# Patient Record
Sex: Male | Born: 1985 | Hispanic: Yes | Marital: Married | Smoking: Former smoker
Health system: Southern US, Community
[De-identification: ages and names within clinical notes are randomized; demographics above are authoritative.]

## PROBLEM LIST (undated history)

## (undated) DIAGNOSIS — J45909 Unspecified asthma, uncomplicated: Secondary | ICD-10-CM

## (undated) HISTORY — PX: HERNIA REPAIR: SHX51

## (undated) HISTORY — DX: Unspecified asthma, uncomplicated: J45.909

---

## 2013-02-21 ENCOUNTER — Encounter (HOSPITAL_COMMUNITY): Payer: Self-pay

## 2013-02-21 ENCOUNTER — Emergency Department (HOSPITAL_COMMUNITY)
Admission: EM | Admit: 2013-02-21 | Discharge: 2013-02-21 | Disposition: A | Discharge status: Home / Self Care | Payer: BC Managed Care – PPO | Attending: Emergency Medicine | Admitting: Emergency Medicine

## 2013-02-21 DIAGNOSIS — S61412A Laceration without foreign body of left hand, initial encounter: Secondary | ICD-10-CM

## 2013-02-21 DIAGNOSIS — Y9389 Activity, other specified: Secondary | ICD-10-CM | POA: Insufficient documentation

## 2013-02-21 DIAGNOSIS — Y9289 Other specified places as the place of occurrence of the external cause: Secondary | ICD-10-CM | POA: Insufficient documentation

## 2013-02-21 DIAGNOSIS — S61409A Unspecified open wound of unspecified hand, initial encounter: Secondary | ICD-10-CM | POA: Insufficient documentation

## 2013-02-21 DIAGNOSIS — W260XXA Contact with knife, initial encounter: Secondary | ICD-10-CM | POA: Insufficient documentation

## 2013-02-21 DIAGNOSIS — W261XXA Contact with sword or dagger, initial encounter: Secondary | ICD-10-CM | POA: Insufficient documentation

## 2013-02-21 NOTE — ED Notes (Signed)
Wound dressed with bacitracin and bandaid. 

## 2013-02-21 NOTE — ED Notes (Signed)
Pt cut his hand between his third and fourth finger while cutting a hamburger, bleeding controlled

## 2013-02-21 NOTE — ED Provider Notes (Signed)
History  This chart was scribed for non-physician practitioner Magnus Sinning, PA-C working with Richardean Canal, MD, by Candelaria Stagers, ED Scribe. This patient was seen in room WTR9/WTR9 and the patient's care was started at 10:41 PM   CSN: [id number]  Arrival date & time 02/21/13  2105   First MD Initiated Contact with Patient 02/21/13 2230      Chief Complaint  Patient presents with  . Extremity Laceration    Patient is a 27 y.o. male presenting with skin laceration. The history is provided by the patient. No language interpreter was used.  Laceration Location:  Hand Hand laceration location:  L hand Quality: straight   Bleeding: controlled   Pain details:    Severity:  Mild   Progression:  Improving Foreign body present:  No foreign bodies Worsened by:  Nothing tried  HPI Comments: Tyler Lopez is a 27 y.o. male who presents to the Emergency Department complaining of a laceration to the palm of his left hand between the fourth and fifth digit after he cut it with a knife about two hours ago.  His tetanus is up to date, 2 months ago.      History reviewed. No pertinent past medical history.  History reviewed. No pertinent past surgical history.  History reviewed. No pertinent family history.  History  Substance Use Topics  . Smoking status: Not on file  . Smokeless tobacco: Not on file  . Alcohol Use: No      Review of Systems  Skin: Positive for wound (laceration between the fourth and fifth digit of the left hand).  All other systems reviewed and are negative.    Allergies  Review of patient's allergies indicates no known allergies.  Home Medications  No current outpatient prescriptions on file.  BP 104/65  Pulse 65  Temp(Src) 98.8 F (37.1 C) (Oral)  Resp 18  Ht 5\' 7"  (1.702 m)  Wt 140 lb (63.504 kg)  BMI 21.92 kg/m2  SpO2 100%  Physical Exam  Nursing note and vitals reviewed. Constitutional: He is oriented to person, place, and time.  He appears well-developed and well-nourished. No distress.  HENT:  Head: Normocephalic and atraumatic.  Eyes: EOM are normal.  Neck: Neck supple. No tracheal deviation present.  Cardiovascular: Normal rate and regular rhythm.   Good cap refill  Pulmonary/Chest: Effort normal. No respiratory distress. He has no wheezes. He has no rales.  Musculoskeletal: Normal range of motion.  Full ROM of MCP to left fourth and fifth digit.     Neurological: He is alert and oriented to person, place, and time.  Sensation intact.   Skin: Skin is warm and dry.  1 cm superficial linear just proximal to the web space between the fourth and fifth digit along the palm of the left hand.  Bleeding controlled.  Psychiatric: He has a normal mood and affect. His behavior is normal.    ED Course  Procedures  DIAGNOSTIC STUDIES: Oxygen Saturation is 100% on room air, normal by my interpretation.    COORDINATION OF CARE:  10:44 PM Discussed course of care with pt which includes laceration repair.  Pt understands and agrees.   LACERATION REPAIR PROCEDURE NOTE The patient's identification was confirmed and consent was obtained. This procedure was performed by Magnus Sinning, PA-C at 11:26 PM. Site: between the fourth and fifth digits of the left hand just proximal to the web space on the palmar side Sterile procedures observed Anesthetic used (type and amt): 2%  lidocaine with epi, 2 ml Suture type/size: 3-0 prolene Length:1 cm  # of Sutures: 2 sutures  Technique: simple interrupted Antibx ointment applied Tetanus UTD Site anesthetized, irrigated with NS, explored without evidence of foreign body, wound well approximated, site covered with dry, sterile dressing.  Patient tolerated procedure well without complications. Instructions for care discussed verbally and patient provided with additional written instructions for homecare and f/u.  Labs Reviewed - No data to display No results found.   No  diagnosis found.    MDM  Patient presenting with laceration of the hand.  Laceration superficial.  No apparent tendon damage.  Neurovascularly intact.  Laceration repaired with sutures without difficulty.  Patient stable for discharge.  I personally performed the services described in this documentation, which was scribed in my presence. The recorded information has been reviewed and is accurate.        Pascal Lux Stokes, PA-C 02/22/13 0104

## 2013-02-21 NOTE — ED Notes (Signed)
Pt ambulatory to exam room with steady gait. Pt arrives with companion.  

## 2013-02-23 NOTE — ED Provider Notes (Signed)
Medical screening examination/treatment/procedure(s) were performed by non-physician practitioner and as supervising physician I was immediately available for consultation/collaboration.   Nadyne Gariepy H Carylon Tamburro, MD 02/23/13 1459 

## 2014-01-23 ENCOUNTER — Telehealth: Payer: Self-pay

## 2014-01-23 NOTE — Telephone Encounter (Signed)
Left message for call back Non identifiable  

## 2014-01-24 ENCOUNTER — Encounter: Payer: Self-pay | Admitting: Internal Medicine

## 2014-01-24 ENCOUNTER — Ambulatory Visit (INDEPENDENT_AMBULATORY_CARE_PROVIDER_SITE_OTHER): Payer: BC Managed Care – PPO | Admitting: Internal Medicine

## 2014-01-24 ENCOUNTER — Ambulatory Visit (HOSPITAL_BASED_OUTPATIENT_CLINIC_OR_DEPARTMENT_OTHER)
Admission: RE | Admit: 2014-01-24 | Discharge: 2014-01-24 | Disposition: A | Discharge status: Home / Self Care | Payer: BC Managed Care – PPO | Source: Ambulatory Visit | Attending: Internal Medicine | Admitting: Internal Medicine

## 2014-01-24 VITALS — BP 95/67 | HR 84 | Temp 97.9°F | Ht 68.0 in | Wt 133.0 lb

## 2014-01-24 DIAGNOSIS — M79609 Pain in unspecified limb: Secondary | ICD-10-CM | POA: Insufficient documentation

## 2014-01-24 DIAGNOSIS — Z0000 Encounter for general adult medical examination without abnormal findings: Secondary | ICD-10-CM

## 2014-01-24 DIAGNOSIS — M1990 Unspecified osteoarthritis, unspecified site: Secondary | ICD-10-CM

## 2014-01-24 NOTE — Progress Notes (Signed)
Pre visit review using our clinic review tool, if applicable. No additional management support is needed unless otherwise documented below in the visit note. 

## 2014-01-24 NOTE — Telephone Encounter (Signed)
Unable to reach prior to visit  

## 2014-01-24 NOTE — Progress Notes (Signed)
   Subjective:    Patient ID: Tyler Lopez, male    DOB: 1985-12-07, 28 y.o.   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 Asthma     as a achild     Past Surgical History  Procedure Laterality Date  . Hernia repair Right     28 y/o    History   Social History  . Marital Status: Married    Spouse Name: N/A    Number of Children: 1  . Years of Education: N/A   Occupational History  . factory Financial controllerworker , Doctor, hospitalealey     Social History Main Topics  . Smoking status: Former Games developermoker  . Smokeless tobacco: Never Used     Comment: quit 12-2013  . Alcohol Use: Yes  . Drug Use: Yes     Comment: occ marihuana   . Sexual Activity: Not on file   Other Topics Concern  . Not on file   Social History Narrative   [place] from [place] ~ 11-2013     Family History  Problem Relation Age of Onset  . Colon cancer Neg Hx   . Prostate cancer Neg Hx   . Diabetes Mother   . Throat cancer Other     GP      Medication List    Notice As of 01/24/2014 11:59 PM   You have not been prescribed any medications.         Objective:   Physical Exam BP 95/67  Pulse 84  Temp(Src) 97.9 F (36.6 C)  Ht 5\' 8"  (1.727 m)  Wt 133 lb (60.328 kg)  BMI 20.23 kg/m2  SpO2 97% General -- alert, well-developed, NAD.  Neck --no thyromegaly   HEENT-- Not pale.  Lungs -- normal respiratory effort, no intercostal retractions, no accessory muscle use, and normal breath sounds.  Heart-- normal rate, regular rhythm, no murmur.  Abdomen-- Not distended, good bowel  sounds,soft, non-tender. Extremities-- no pretibial edema bilaterally  Hands and wrists-- no synovitis but all PIPs and DIPs are quite enlarged, slt tender? No red-warm Neurologic--  alert & oriented X3. Speech normal, gait normal, strength normal in all extremities.  Psych-- Cognition and judgment appear intact. Cooperative with normal attention span and concentration. No anxious or depressed appearing.        Assessment & Plan:

## 2014-01-24 NOTE — Assessment & Plan Note (Signed)
occ hand soreness , no synovitis on exam but hand joints quite prominent, check XRs (?early DJD, r/o erosions). Check a sed rate

## 2014-01-24 NOTE — Assessment & Plan Note (Signed)
up todate on [facility] STE  Concerned about his wt, has been ~130, 140 pounds all his life. rec abundant but healthy nutrition.

## 2014-01-24 NOTE — Patient Instructions (Signed)
Get your blood work before you leave   Get the XR at [facility], corner of [address] (10 minutes form here); they are open 24/7 [address]  [place], [place] [id number] [phone]  Next visit is for a physical exam in 1 year, fasting Please make an appointment       Testicular Self-Exam A self-examination of your testicles involves looking at and feeling your testicles for abnormal lumps or swelling. Several things can cause swelling, lumps, or pain in your testicles. Some of these causes are:  Injuries.  Inflammation.  Infection.  Accumulation of fluids around your testicle (hydrocele).  Twisted testicles (testicular torsion).  Testicular cancer. Self-examination of the testicles and groin areas may be advised if you are at risk for testicular cancer. Risks for testicular cancer include:  An undescended testicle (cryptorchidism).  A history of previous testicular cancer.  A family history of testicular cancer. The testicles are easiest to examine after warm baths or showers and are more difficult to examine when you are cold. This is because the muscles attached to the testicles retract and pull them up higher or into the abdomen. Follow these steps while you are standing:  Hold your penis away from your body.  Roll one testicle between your thumb and forefinger, feeling the entire testicle.  Roll the other testicle between your thumb and forefinger, feeling the entire testicle. Feel for lumps, swelling, or discomfort. A normal testicle is egg shaped and feels firm. It is smooth and not tender. The spermatic cord can be felt as a firm spaghetti-like cord at the back of your testicle. It is also important to examine the crease between the front of your leg and your abdomen. Feel for any bumps that are tender. These could be enlarged lymph nodes.  Document Released: 12/05/2000 Document Revised: 05/01/2013 Document Reviewed:  02/18/2013 [facility] Patient Information 2014 [place].

## 2014-01-25 LAB — CBC WITH DIFFERENTIAL/PLATELET
BASOS PCT: 0 % (ref 0–1)
Basophils Absolute: 0 10*3/uL (ref 0.0–0.1)
EOS ABS: 0.1 10*3/uL (ref 0.0–0.7)
Eosinophils Relative: 1 % (ref 0–5)
HCT: 43.1 % (ref 39.0–52.0)
Hemoglobin: 15 g/dL (ref 13.0–17.0)
Lymphocytes Relative: 32 % (ref 12–46)
Lymphs Abs: 3.3 10*3/uL (ref 0.7–4.0)
MCH: 31.3 pg (ref 26.0–34.0)
MCHC: 34.8 g/dL (ref 30.0–36.0)
MCV: 90 fL (ref 78.0–100.0)
MONOS PCT: 7 % (ref 3–12)
Monocytes Absolute: 0.7 10*3/uL (ref 0.1–1.0)
Neutro Abs: 6.1 10*3/uL (ref 1.7–7.7)
Neutrophils Relative %: 60 % (ref 43–77)
PLATELETS: 310 10*3/uL (ref 150–400)
RBC: 4.79 MIL/uL (ref 4.22–5.81)
RDW: 13.5 % (ref 11.5–15.5)
WBC: 10.2 10*3/uL (ref 4.0–10.5)

## 2014-01-25 LAB — COMPREHENSIVE METABOLIC PANEL
ALK PHOS: 89 U/L (ref 39–117)
ALT: 28 U/L (ref 0–53)
AST: 31 U/L (ref 0–37)
Albumin: 4.5 g/dL (ref 3.5–5.2)
BILIRUBIN TOTAL: 0.4 mg/dL (ref 0.2–1.2)
BUN: 19 mg/dL (ref 6–23)
CO2: 25 meq/L (ref 19–32)
CREATININE: 1.05 mg/dL (ref 0.50–1.35)
Calcium: 9.9 mg/dL (ref 8.4–10.5)
Chloride: 104 mEq/L (ref 96–112)
GLUCOSE: 86 mg/dL (ref 70–99)
Potassium: 4.2 mEq/L (ref 3.5–5.3)
Sodium: 140 mEq/L (ref 135–145)
TOTAL PROTEIN: 7.1 g/dL (ref 6.0–8.3)

## 2014-01-25 LAB — TSH: TSH: 2.386 u[IU]/mL (ref 0.350–4.500)

## 2014-01-25 LAB — SEDIMENTATION RATE: SED RATE: 1 mm/h (ref 0–16)

## 2014-01-25 LAB — CHOLESTEROL, TOTAL: CHOLESTEROL: 206 mg/dL — AB (ref 0–200)

## 2014-01-28 ENCOUNTER — Encounter: Payer: Self-pay | Admitting: *Deleted

## 2014-01-28 NOTE — Progress Notes (Signed)
Letter sent to patient regarding test results

## 2015-05-01 ENCOUNTER — Other Ambulatory Visit (INDEPENDENT_AMBULATORY_CARE_PROVIDER_SITE_OTHER): Payer: BLUE CROSS/BLUE SHIELD

## 2015-05-01 ENCOUNTER — Encounter: Payer: Self-pay | Admitting: Family

## 2015-05-01 ENCOUNTER — Ambulatory Visit (INDEPENDENT_AMBULATORY_CARE_PROVIDER_SITE_OTHER): Payer: BLUE CROSS/BLUE SHIELD | Admitting: Family

## 2015-05-01 VITALS — BP 102/66 | HR 79 | Temp 97.9°F | Resp 18 | Ht 68.0 in | Wt 137.0 lb

## 2015-05-01 DIAGNOSIS — M1990 Unspecified osteoarthritis, unspecified site: Secondary | ICD-10-CM

## 2015-05-01 LAB — SEDIMENTATION RATE: Sed Rate: 4 mm/hr (ref 0–22)

## 2015-05-01 LAB — RHEUMATOID FACTOR: Rhuematoid fact SerPl-aCnc: <10 IU/mL (ref <=14)

## 2015-05-01 LAB — C-REACTIVE PROTEIN: CRP: <0.1 mg/dL — ABNORMAL LOW (ref 0.5–20.0)

## 2015-05-01 MED ORDER — MELOXICAM 15 MG PO TABS: 7.5000 mg | ORAL_TABLET | Freq: Every day | ORAL | Status: AC

## 2015-05-01 NOTE — Progress Notes (Signed)
   Subjective:    Patient ID: Tyler Lopez, male    DOB: [birth year], 29 y.o.   MRN: [id number]  Chief Complaint  Patient presents with  . Establish Care    has problems with his hands, states that it happens on and off, its getting to where his finger cramp up and he can't even where his wedding ring, wants to know about referral to rheumatology, mom has bad arthritis    HPI:  Tyler Lopez is a 29 y.o. male with a PMH of asthma and hernia repair who presents today for an office visit and to establish care with this provider.    1.) Hand pain - Associated symptom of pain located in both hands described as stabbing. Reports that his fingers swell on and off. Modifying factors include ibuprofen and aleve which help a little. 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 He is primarily right handed. Severity of the pain 7-8/10 on average with timing of symptoms being worst in the morning and mid-afternoon. His family history is significant for arthritis.   No Known Allergies  No current outpatient prescriptions on file prior to visit.   No current facility-administered medications on file prior to visit.    Review of Systems  Constitutional: Negative for fever and chills.  Musculoskeletal: Positive for joint swelling and arthralgias.      Objective:    BP 102/66 mmHg  Pulse 79  Temp(Src) 97.9 F (36.6 C) (Oral)  Resp 18  Ht $R'5\' 8"'Gz$  (1.727 m)  Wt 137 lb (62.143 kg)  BMI 20.84 kg/m2  SpO2 97% Nursing note and vital signs reviewed.  Physical Exam  Constitutional: He is oriented to person, place, and time. He appears well-developed and well-nourished. No distress.  Cardiovascular: Normal rate, regular rhythm, normal heart sounds and intact distal pulses.   Pulmonary/Chest: Effort normal and breath sounds normal.  Musculoskeletal:  Bilateral Hands - Brouchard's appearing nodes present.  Finger range of motion is limited secondary to inflammation. Pulses and sensation are intact and appropriate.   Neurological: He is alert and oriented to person, place, and time.  Skin: Skin is warm and dry.  Psychiatric: He has a normal mood and affect. His behavior is normal. Judgment and thought content normal.       Assessment & Plan:   Problem List Items Addressed This Visit      Musculoskeletal and Integument   Arthritis - Primary    Symptoms and exam consistent with osteoarthritis most likely related to repetitive motion at work. Previous x-rays showed no arthropathy about 1 year ago. Obtain rheumatoid factor, ESR, CRP, and ANA to rule out rheumatoid arthritis. Start meloxicam as needed for pain. Treat conservatively with heat/ice as needed. Follow up pending lab results.       Relevant Medications   meloxicam (MOBIC) 15 MG tablet   Other Relevant Orders   Rheumatoid Factor   C-reactive protein   Sed Rate (ESR)   Antinuclear Antib (ANA)

## 2015-05-01 NOTE — Patient Instructions (Addendum)
Thank you for choosing [facility].  Summary/Instructions:  Your prescription(s) have been submitted to your pharmacy or been printed and provided for you. Please take as directed and contact our office if you believe you are having problem(s) with the medication(s) or have any questions.  Please stop by the lab on the basement level of the building for your blood work. Your results will be released to MyChart (or called to you) after review, usually within 72 hours after test completion. If any changes need to be made, you will be notified at that same time.  If your symptoms worsen or fail to improve, please contact our office for further instruction, or in case of emergency go directly to the emergency room at the closest medical facility.   Osteoarthritis Osteoarthritis is a disease that causes soreness and inflammation of a joint. It occurs when the cartilage at the affected joint wears down. Cartilage acts as a cushion, covering the ends of bones where they meet to form a joint. Osteoarthritis is the most common form of arthritis. It often occurs in older people. The joints affected most often by this condition include those in the:  Ends of the fingers.  Thumbs.  Neck.  Lower back.  Knees.  Hips. CAUSES  Over time, the cartilage that covers the ends of bones begins to wear away. This causes bone to rub on bone, producing pain and stiffness in the affected joints.  RISK FACTORS Certain factors can increase your chances of having osteoarthritis, including:  Older age.  Excessive body weight.  Overuse of joints.  Previous joint injury. SIGNS AND SYMPTOMS   Pain, swelling, and stiffness in the joint.  Over time, the joint may lose its normal shape.  Small deposits of bone (osteophytes) may grow on the edges of the joint.  Bits of bone or cartilage can break off and float inside the joint space. This may cause more pain and damage. DIAGNOSIS  Your health care  provider will do a physical exam and ask about your symptoms. Various tests may be ordered, such as:  X-rays of the affected joint.  An MRI scan.  Blood tests to rule out other types of arthritis.  Joint fluid tests. This involves using a needle to draw fluid from the joint and examining the fluid under a microscope. TREATMENT  Goals of treatment are to control pain and improve joint function. Treatment plans may include:  A prescribed exercise program that allows for rest and joint relief.  A weight control plan.  Pain relief techniques, such as:  Properly applied heat and cold.  Electric pulses delivered to nerve endings under the skin (transcutaneous electrical nerve stimulation [TENS]).  Massage.  Certain nutritional supplements.  Medicines to control pain, such as:  Acetaminophen.  Nonsteroidal anti-inflammatory drugs (NSAIDs), such as naproxen.  Narcotic or central-acting agents, such as tramadol.  Corticosteroids. These can be given orally or as an injection.  Surgery to reposition the bones and relieve pain (osteotomy) or to remove loose pieces of bone and cartilage. Joint replacement may be needed in advanced states of osteoarthritis. HOME CARE INSTRUCTIONS   Take medicines only as directed by your health care provider.  Maintain a healthy weight. Follow your health care provider's instructions for weight control. This may include dietary instructions.  Exercise as directed. Your health care provider can recommend specific types of exercise. These may include:  Strengthening exercises. These are done to strengthen the muscles that support joints affected by arthritis. They can be  performed with weights or with exercise bands to add resistance.  Aerobic activities. These are exercises, such as brisk walking or low-impact aerobics, that get your heart pumping.  Range-of-motion activities. These keep your joints limber.  Balance and agility exercises. These  help you maintain daily living skills.  Rest your affected joints as directed by your health care provider.  Keep all follow-up visits as directed by your health care provider. SEEK MEDICAL CARE IF:   Your skin turns red.  You develop a rash in addition to your joint pain.  You have worsening joint pain.  You have a fever along with joint or muscle aches. SEEK IMMEDIATE MEDICAL CARE IF:  You have a significant loss of weight or appetite.  You have night sweats. FOR MORE INFORMATION   National Institute of Arthritis and Musculoskeletal and Skin Diseases: www.niams.http://www.myers.net/  [employer] on Aging: https://walker.com/  American College of Rheumatology: www.rheumatology.org Document Released: 08/29/2005 Document Revised: 01/13/2014 Document Reviewed: 05/06/2013 [facility] Patient Information 2015 [place], [place]. This information is not intended to replace advice given to you by your health care provider. Make sure you discuss any questions you have with your health care provider.   Rheumatoid Arthritis Rheumatoid arthritis is a long-term (chronic) inflammatory disease that causes pain, swelling, and stiffness of the joints. It can affect the entire body, including the eyes and lungs. The effects of rheumatoid arthritis vary widely among those with the condition. CAUSES  The cause of rheumatoid arthritis is not known. It tends to run in families and is more common in women. Certain cells of the body's natural defense system (immune system) do not work properly and begin to attack healthy joints. It primarily involves the connective tissue that lines the joints (synovial membrane). This can cause damage to the joint. SYMPTOMS   Pain, stiffness, swelling, and decreased motion of many joints, especially in the hands and feet.  Stiffness that is worse in the morning. It may last 1-2 hours or longer.  Numbness and tingling in the hands.  Fatigue.  Loss of appetite.  Weight  loss.  Low-grade fever.  Dry eyes and mouth.  Firm lumps (rheumatoid nodules) that grow beneath the skin in areas such as the elbows and hands. DIAGNOSIS  Diagnosis is based on the symptoms described, an exam, and blood tests. Sometimes, X-rays are helpful. TREATMENT  The goals of treatment are to relieve pain, reduce inflammation, and to slow down or stop joint damage and disability. Methods vary and may include:  Maintaining a balance of rest, exercise, and proper nutrition.  Medicines:  Pain relievers (analgesics).  Corticosteroids and nonsteroidal anti-inflammatory drugs (NSAIDs) to reduce inflammation.  Disease-modifying antirheumatic drugs (DMARDs) to try to slow the course of the disease.  Biologic response modifiers to reduce inflammation and damage.  Physical therapy and occupational therapy.  Surgery for patients with severe joint damage. Joint replacement or fusing of joints may be needed.  Routine monitoring and ongoing care, such as office visits, blood and urine tests, and X-rays. HOME CARE INSTRUCTIONS   Remain physically active and reduce activity when the disease gets worse.  Eat a well-balanced diet.  Put heat on affected joints when you wake up and before activities. Keep the heat on the affected joint for as long as directed by your health care provider.  Put ice on affected joints following activities or exercising.  Put ice in a plastic bag.  Place a towel between your skin and the bag.  Leave the ice on for  15-20 minutes, 3-4 times per day, or as directed by your health care provider.  Take medicines and supplements only as directed by your health care provider.  Use splints as directed by your health care provider. Splints help maintain joint position and function.  Do not sleep with pillows under your knees. This may lead to spasms.  Participate in a self-management program to keep current with the latest treatment and coping skills. SEEK  IMMEDIATE MEDICAL CARE IF:  You have fainting episodes.  You have periods of extreme weakness.  You rapidly develop a hot, painful joint that is more severe than usual joint aches.  You have chills.  You have a fever. FOR MORE INFORMATION   American College of Rheumatology: www.rheumatology.org  Arthritis Foundation: www.arthritis.org Document Released: 08/26/2000 Document Revised: 01/13/2014 Document Reviewed: 10/05/2011 [facility] Patient Information 2015 [place], [place]. This information is not intended to replace advice given to you by your health care provider. Make sure you discuss any questions you have with your health care provider.

## 2015-05-01 NOTE — Assessment & Plan Note (Signed)
Symptoms and exam consistent with osteoarthritis most likely related to repetitive motion at work. Previous x-rays showed no arthropathy about 1 year ago. Obtain rheumatoid factor, ESR, CRP, and ANA to rule out rheumatoid arthritis. Start meloxicam as needed for pain. Treat conservatively with heat/ice as needed. Follow up pending lab results.

## 2015-05-01 NOTE — Progress Notes (Signed)
Pre visit review using our clinic review tool, if applicable. No additional management support is needed unless otherwise documented below in the visit note. 

## 2015-05-04 ENCOUNTER — Telehealth: Payer: Self-pay | Admitting: Family

## 2015-05-04 DIAGNOSIS — M1990 Unspecified osteoarthritis, unspecified site: Secondary | ICD-10-CM

## 2015-05-04 LAB — ANA: ANA: NEGATIVE

## 2015-05-04 NOTE — Telephone Encounter (Signed)
Please inform patient that his blood work shows that his tests for rheumatoid arthritis are negative and therefore most likely dealing with osteoarthritis. Therefore we can continue with the rheumatology consult if the patient wishes.

## 2015-05-06 NOTE — Telephone Encounter (Signed)
LVM for pt to call back.

## 2015-05-07 NOTE — Telephone Encounter (Signed)
Patients wife called back.  I gave her the results.  She would like referral to rheumatologist to be entered.

## 2015-05-07 NOTE — Telephone Encounter (Signed)
Referral placed.

## 2015-05-07 NOTE — Telephone Encounter (Signed)
Patients wife calling you back  Can you please call her at [phone] regarding pts lab work

## 2015-11-06 IMAGING — CR DG HAND COMPLETE 3+V*L*
3 series · 3 of 3 positions shown · non-contrast
Comparison: None.

CLINICAL DATA: Bilateral hand pain.

EXAM:
LEFT HAND - COMPLETE 3+ VIEW

[x hand pa left]
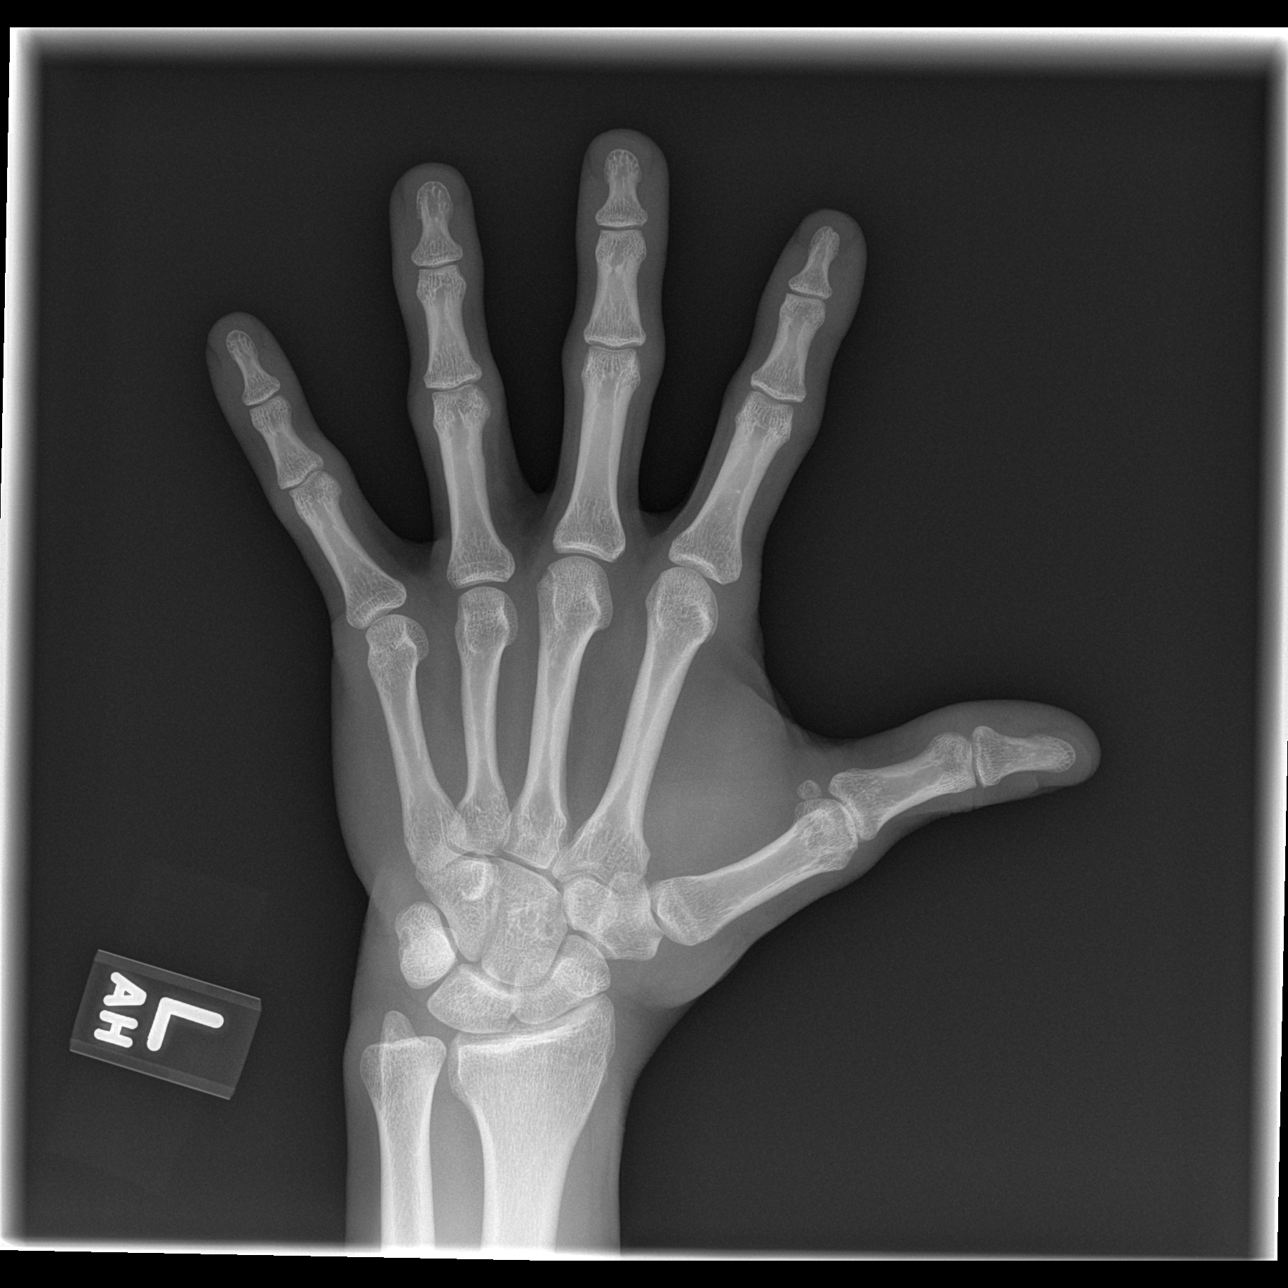

[x hand oblique left]
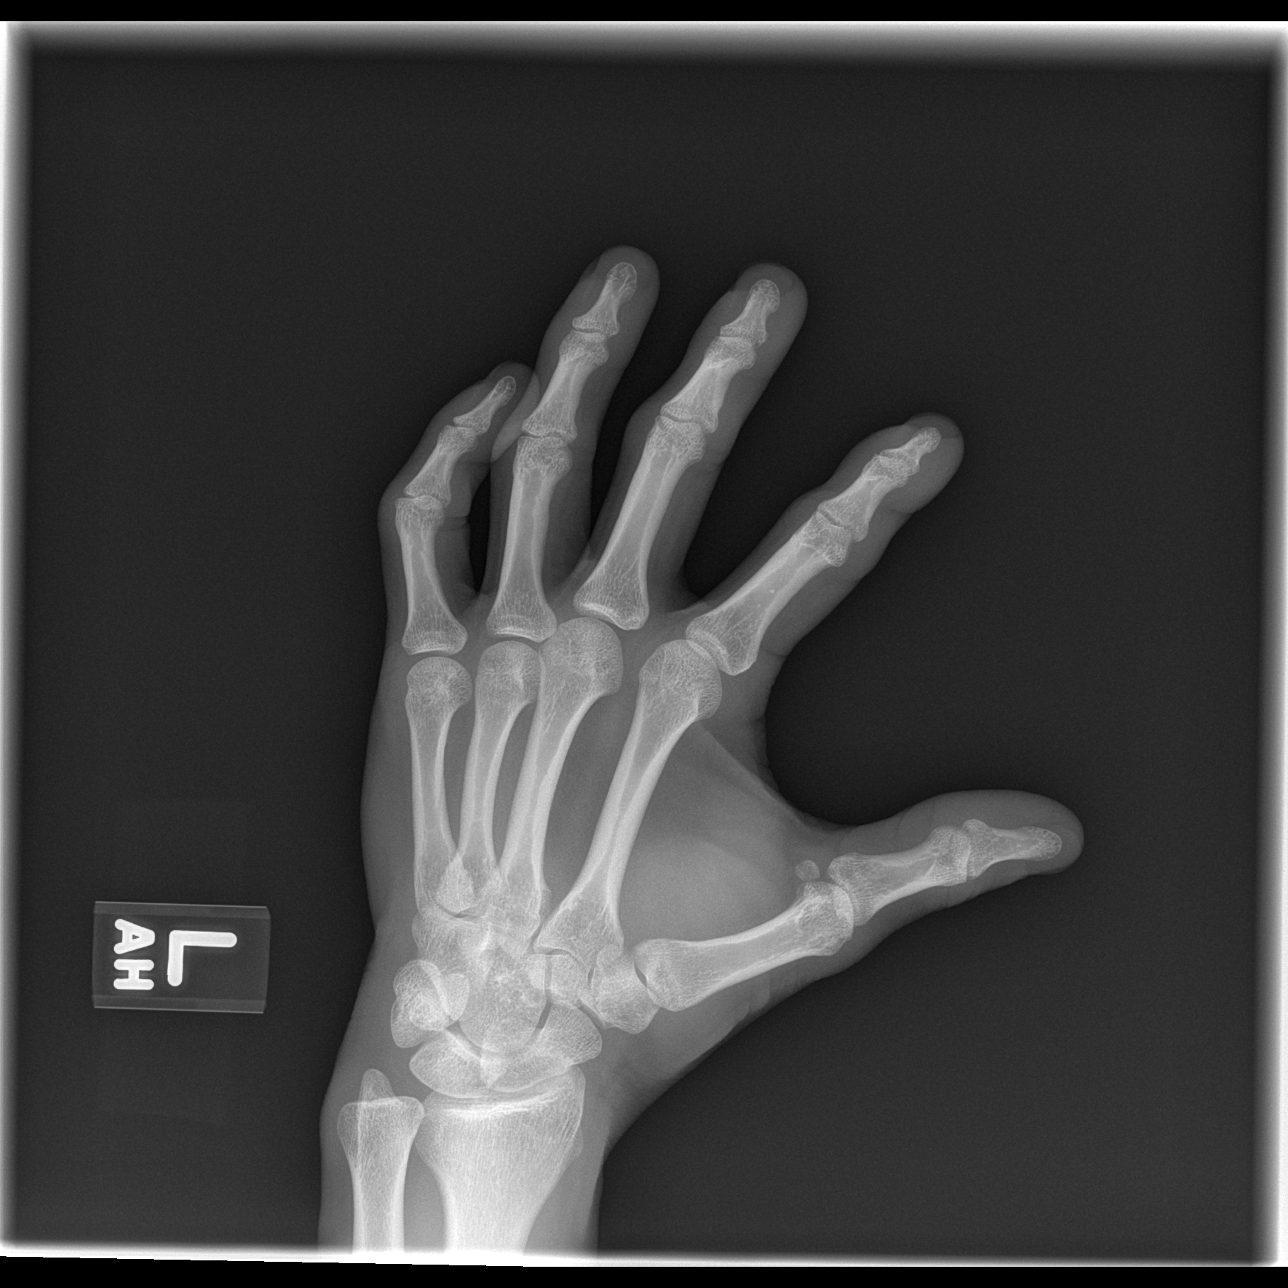

[x hand lat left]
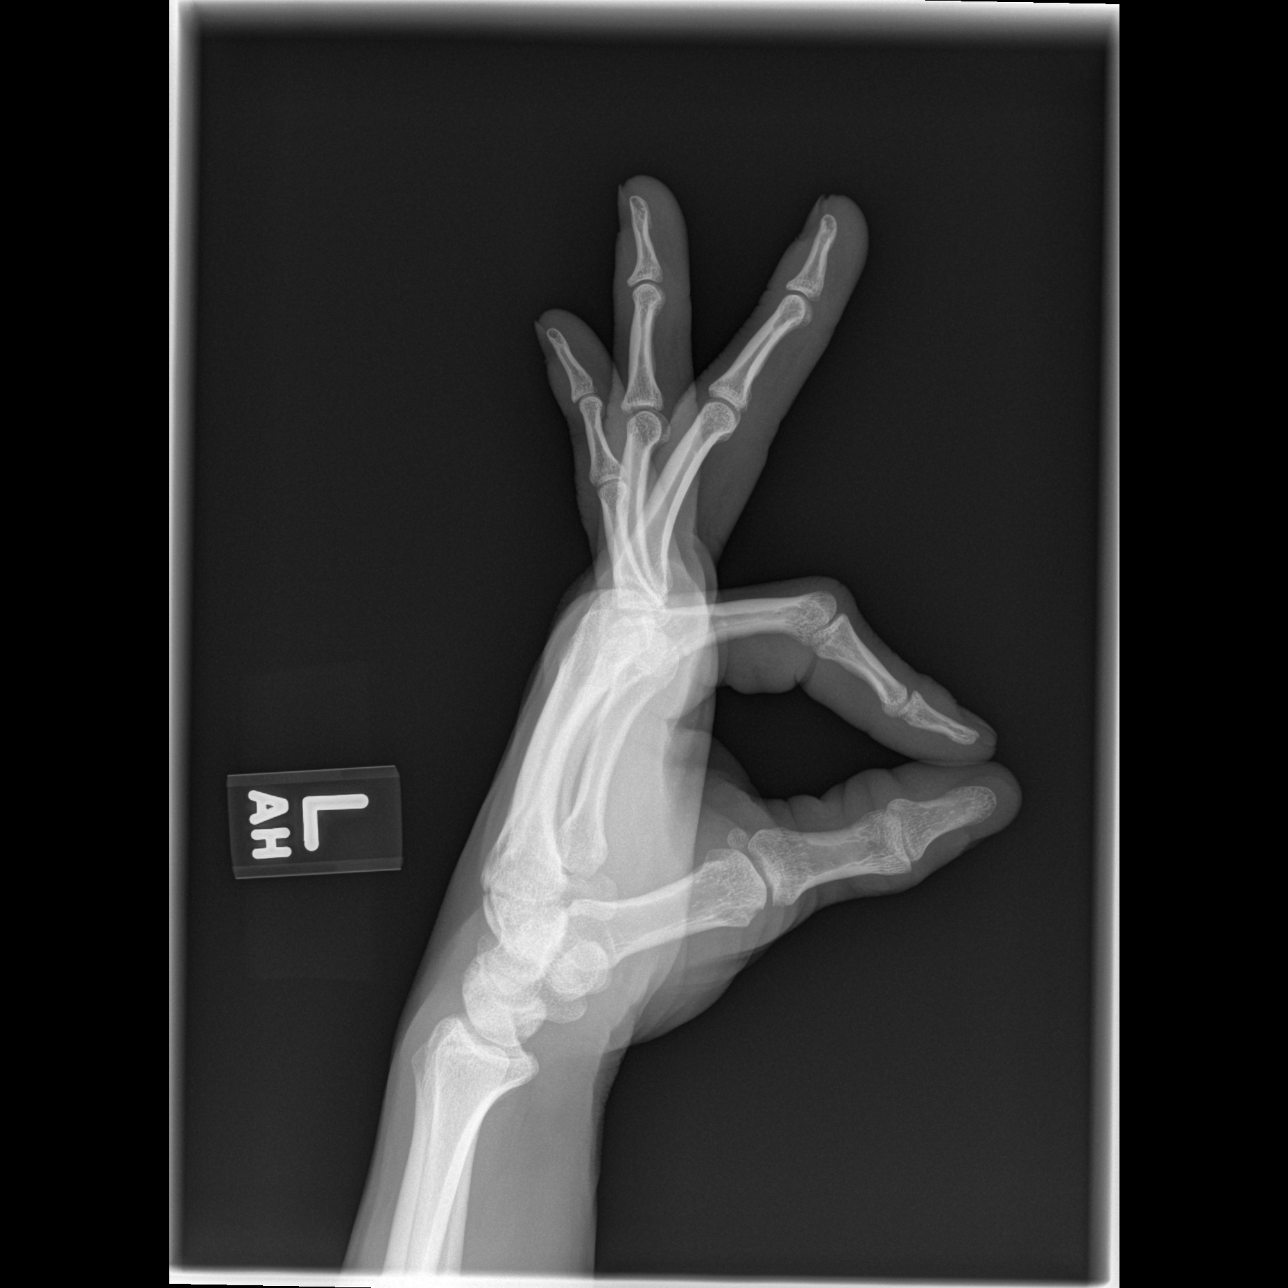

[3 of 3 positions shown; findings below may reference images not displayed]

FINDINGS: Joint spaces and alignment are maintained. No erosion is identified.
Mineralization is normal. There is no fracture. Soft tissue
structures appear normal.
IMPRESSION: Negative for arthropathy.  Negative exam.
